# Patient Record
Sex: Male | Born: 2005 | Race: White | Hispanic: No | Marital: Single | Smoking: Never smoker
Health system: Southern US, Community
[De-identification: ages and names within clinical notes are randomized; demographics above are authoritative.]

## PROBLEM LIST (undated history)

## (undated) DIAGNOSIS — F909 Attention-deficit hyperactivity disorder, unspecified type: Secondary | ICD-10-CM

## (undated) DIAGNOSIS — R011 Cardiac murmur, unspecified: Secondary | ICD-10-CM

## (undated) DIAGNOSIS — T7840XA Allergy, unspecified, initial encounter: Secondary | ICD-10-CM

## (undated) DIAGNOSIS — K5229 Other allergic and dietetic gastroenteritis and colitis: Secondary | ICD-10-CM

## (undated) HISTORY — DX: Allergy, unspecified, initial encounter: T78.40XA

## (undated) HISTORY — DX: Attention-deficit hyperactivity disorder, unspecified type: F90.9

## (undated) HISTORY — DX: Other allergic and dietetic gastroenteritis and colitis: K52.29

## (undated) HISTORY — DX: Cardiac murmur, unspecified: R01.1

## (undated) HISTORY — PX: TYMPANOSTOMY TUBE PLACEMENT: SHX32

---
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
## 2024-01-05 ENCOUNTER — Ambulatory Visit: Admitting: Family Medicine

## 2024-01-13 DIAGNOSIS — L2089 Other atopic dermatitis: Secondary | ICD-10-CM | POA: Diagnosis not present

## 2024-02-16 ENCOUNTER — Encounter: Payer: Self-pay | Admitting: Family Medicine

## 2024-02-17 NOTE — Telephone Encounter (Signed)
 These referrals were placed previously and are still valid. Please send him the contact info for the offices these were sent to from the Referral tab in chart review. Thanks!

## 2024-03-02 ENCOUNTER — Ambulatory Visit: Admitting: Podiatry

## 2024-03-02 DIAGNOSIS — L600 Ingrowing nail: Secondary | ICD-10-CM | POA: Diagnosis not present

## 2024-03-02 NOTE — Progress Notes (Signed)
" ° °  Chief Complaint  Patient presents with   Ingrown Toenail    Pt is here to f/u on left great toenail after having ingrown removed, states when he made the appointment toe was red, swollen and puss draining this was a few weeks ago, states now the toe is better.    Subjective: Patient presents today for follow-up evaluation after ingrown toenail procedure to the left great toe.  He says over the last week he noticed some increased redness and swelling however the pains he simply improved and it is no longer sensitive.  Past Medical History:  Diagnosis Date   Abdominal pain    ADHD (attention deficit hyperactivity disorder)    Allergic colitis    Allergy    [employer]reesia 10/12/2019   Fecal soiling    Heart murmur    [employer]reesia 10/12/2019    Past Surgical History:  Procedure Laterality Date   TYMPANOSTOMY TUBE PLACEMENT      Allergies  Allergen Reactions   Peanut-Containing Drug Products     Strong reaction on Almonds   Apple Juice    Corn-Containing Products    Egg Protein-Containing Drug Products     Per mother patient is not allergic to eggs anymore.   Other Itching and Rash    Objective:  General: Well developed, nourished, in no acute distress, alert and oriented x3   Dermatology: There is some hyperkeratotic tissue and nail debris noted along the medial border of the left great toenail avulsion site.  This was debrided today.  I do believe that this tissue was causing the inflammation to the area  Vascular: DP and PT pulses palpable.  No clinical evidence of vascular compromise  Neruologic: Grossly intact via light touch bilateral.  Musculoskeletal: No pedal deformity noted  Assesement: #1 s/p partial permanent nail matricectomy medial and lateral border LT great toe.  11/25/2023 #2 H/o partial permanent nail matrixectomy medial and lateral border RT great toe.  11/08/2022   Plan of Care:  -Patient evaluated.  - Light debridement was performed today of the left  great toenail.  There was some portions of callus tissue and nail debris that was debrided from the area.  He states that it feels much better -Recommend continued soaking for the next few days to ensure that the redness and swelling completely resolves.  Currently he states it is about 90% better -Continue good fitting shoes that do not irritate or constrict the toebox area -Return to clinic PRN  *works at [employer]  Thresa EMERSON Sar, DPM [facility]  Dr. Thresa EMERSON Sar, DPM    [address] [place], [place] [address]                Office [phone]  Fax [phone]      "

## 2024-03-25 ENCOUNTER — Ambulatory Visit: Admitting: Allergy

## 2024-05-24 ENCOUNTER — Encounter: Admitting: Family Medicine
